# Patient Record
Sex: Male | Born: 1995 | Hispanic: Yes | Marital: Single | Smoking: Never smoker
Health system: Southern US, Community
[De-identification: ages and names within clinical notes are randomized; demographics above are authoritative.]

---

## 2018-04-17 ENCOUNTER — Emergency Department (HOSPITAL_COMMUNITY): Payer: Medicaid Other

## 2018-04-17 ENCOUNTER — Encounter (HOSPITAL_COMMUNITY): Payer: Self-pay | Admitting: Emergency Medicine

## 2018-04-17 ENCOUNTER — Emergency Department (HOSPITAL_COMMUNITY)
Admission: EM | Admit: 2018-04-17 | Discharge: 2018-04-17 | Disposition: A | Discharge status: Home / Self Care | Payer: Medicaid Other | Attending: Emergency Medicine | Admitting: Emergency Medicine

## 2018-04-17 DIAGNOSIS — S0990XA Unspecified injury of head, initial encounter: Secondary | ICD-10-CM | POA: Diagnosis present

## 2018-04-17 DIAGNOSIS — M546 Pain in thoracic spine: Secondary | ICD-10-CM | POA: Diagnosis not present

## 2018-04-17 DIAGNOSIS — Y92410 Unspecified street and highway as the place of occurrence of the external cause: Secondary | ICD-10-CM | POA: Diagnosis not present

## 2018-04-17 DIAGNOSIS — S060X9A Concussion with loss of consciousness of unspecified duration, initial encounter: Secondary | ICD-10-CM | POA: Diagnosis not present

## 2018-04-17 DIAGNOSIS — Y999 Unspecified external cause status: Secondary | ICD-10-CM | POA: Diagnosis not present

## 2018-04-17 DIAGNOSIS — Y939 Activity, unspecified: Secondary | ICD-10-CM | POA: Diagnosis not present

## 2018-04-17 LAB — CBC
HCT: 47.8 % (ref 39.0–52.0)
Hemoglobin: 16 g/dL (ref 13.0–17.0)
MCH: 29.9 pg (ref 26.0–34.0)
MCHC: 33.5 g/dL (ref 30.0–36.0)
MCV: 89.2 fL (ref 80.0–100.0)
Platelets: 223 10*3/uL (ref 150–400)
RBC: 5.36 MIL/uL (ref 4.22–5.81)
RDW: 12.2 % (ref 11.5–15.5)
WBC: 7.4 10*3/uL (ref 4.0–10.5)
nRBC: 0 % (ref 0.0–0.2)

## 2018-04-17 LAB — I-STAT CHEM 8, ED
BUN: 19 mg/dL (ref 6–20)
Calcium, Ion: 1.06 mmol/L — ABNORMAL LOW (ref 1.15–1.40)
Chloride: 104 mmol/L (ref 98–111)
Creatinine, Ser: 1 mg/dL (ref 0.61–1.24)
Glucose, Bld: 108 mg/dL — ABNORMAL HIGH (ref 70–99)
HEMATOCRIT: 48 % (ref 39.0–52.0)
Hemoglobin: 16.3 g/dL (ref 13.0–17.0)
Potassium: 3.4 mmol/L — ABNORMAL LOW (ref 3.5–5.1)
Sodium: 138 mmol/L (ref 135–145)
TCO2: 22 mmol/L (ref 22–32)

## 2018-04-17 LAB — COMPREHENSIVE METABOLIC PANEL
ALT: 28 U/L (ref 0–44)
AST: 26 U/L (ref 15–41)
Albumin: 4.5 g/dL (ref 3.5–5.0)
Alkaline Phosphatase: 66 U/L (ref 38–126)
Anion gap: 10 (ref 5–15)
BUN: 16 mg/dL (ref 6–20)
CO2: 21 mmol/L — ABNORMAL LOW (ref 22–32)
CREATININE: 1.13 mg/dL (ref 0.61–1.24)
Calcium: 9.2 mg/dL (ref 8.9–10.3)
Chloride: 106 mmol/L (ref 98–111)
GFR calc Af Amer: >60 mL/min (ref >60)
GFR calc non Af Amer: >60 mL/min (ref >60)
Glucose, Bld: 112 mg/dL — ABNORMAL HIGH (ref 70–99)
Potassium: 3.4 mmol/L — ABNORMAL LOW (ref 3.5–5.1)
Sodium: 137 mmol/L (ref 135–145)
Total Bilirubin: 1 mg/dL (ref 0.3–1.2)
Total Protein: 7.4 g/dL (ref 6.5–8.1)

## 2018-04-17 LAB — SAMPLE TO BLOOD BANK

## 2018-04-17 LAB — URINALYSIS, ROUTINE W REFLEX MICROSCOPIC
Bilirubin Urine: NEGATIVE
Glucose, UA: NEGATIVE mg/dL
Hgb urine dipstick: NEGATIVE
Ketones, ur: 20 mg/dL — AB
Leukocytes, UA: NEGATIVE
Nitrite: NEGATIVE
Protein, ur: NEGATIVE mg/dL
SPECIFIC GRAVITY, URINE: 1.029 (ref 1.005–1.030)
pH: 5 (ref 5.0–8.0)

## 2018-04-17 LAB — ETHANOL: Alcohol, Ethyl (B): <10 mg/dL (ref <10)

## 2018-04-17 LAB — PROTIME-INR
INR: 0.99
Prothrombin Time: 13 seconds (ref 11.4–15.2)

## 2018-04-17 LAB — CDS SEROLOGY

## 2018-04-17 LAB — I-STAT CG4 LACTIC ACID, ED: Lactic Acid, Venous: 1.45 mmol/L (ref 0.5–1.9)
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
## 2018-04-17 NOTE — ED Notes (Signed)
Patient transported to CT 
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
## 2018-04-17 NOTE — Discharge Instructions (Addendum)
Continue Tylenol and Motrin for pain as needed.
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
## 2018-04-20 ENCOUNTER — Ambulatory Visit: Payer: Self-pay

## 2018-04-20 ENCOUNTER — Telehealth: Payer: Self-pay

## 2018-04-20 NOTE — Telephone Encounter (Signed)
Spoke with patient who wanted to schedule appointment in concussion clinic. Informed patient that we unfortunately do not take his insurance and he should follow up with his PCP for evaluation and care. Patient voices understanding.
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
